# Patient Record
Sex: Female | Born: 1995 | Race: Black or African American | Hispanic: No | Marital: Single | Smoking: Never smoker
Health system: Southern US, Community
[De-identification: ages and names within clinical notes are randomized; demographics above are authoritative.]

## PROBLEM LIST (undated history)

## (undated) DIAGNOSIS — D509 Iron deficiency anemia, unspecified: Secondary | ICD-10-CM

---

## 2014-03-14 ENCOUNTER — Emergency Department (HOSPITAL_COMMUNITY)
Admission: EM | Admit: 2014-03-14 | Discharge: 2014-03-14 | Disposition: A | Discharge status: Home / Self Care | Payer: Managed Care, Other (non HMO) | Attending: Emergency Medicine | Admitting: Emergency Medicine

## 2014-03-14 ENCOUNTER — Encounter (HOSPITAL_COMMUNITY): Payer: Self-pay | Admitting: Emergency Medicine

## 2014-03-14 DIAGNOSIS — Z8709 Personal history of other diseases of the respiratory system: Secondary | ICD-10-CM | POA: Diagnosis not present

## 2014-03-14 DIAGNOSIS — R404 Transient alteration of awareness: Secondary | ICD-10-CM | POA: Insufficient documentation

## 2014-03-14 DIAGNOSIS — R55 Syncope and collapse: Secondary | ICD-10-CM | POA: Diagnosis not present

## 2014-03-14 DIAGNOSIS — Z3202 Encounter for pregnancy test, result negative: Secondary | ICD-10-CM | POA: Insufficient documentation

## 2014-03-14 HISTORY — DX: Iron deficiency anemia, unspecified: D50.9

## 2014-03-14 LAB — CBC WITH DIFFERENTIAL/PLATELET
Basophils Absolute: 0 10*3/uL (ref 0.0–0.1)
Basophils Relative: 0 % (ref 0–1)
EOS PCT: 2 % (ref 0–5)
Eosinophils Absolute: 0.1 10*3/uL (ref 0.0–0.7)
HCT: 33.3 % — ABNORMAL LOW (ref 36.0–46.0)
HEMOGLOBIN: 11.1 g/dL — AB (ref 12.0–15.0)
LYMPHS ABS: 2 10*3/uL (ref 0.7–4.0)
Lymphocytes Relative: 29 % (ref 12–46)
MCH: 29.4 pg (ref 26.0–34.0)
MCHC: 33.3 g/dL (ref 30.0–36.0)
MCV: 88.1 fL (ref 78.0–100.0)
MONOS PCT: 6 % (ref 3–12)
Monocytes Absolute: 0.4 10*3/uL (ref 0.1–1.0)
NEUTROS PCT: 63 % (ref 43–77)
Neutro Abs: 4.4 10*3/uL (ref 1.7–7.7)
Platelets: 257 10*3/uL (ref 150–400)
RBC: 3.78 MIL/uL — AB (ref 3.87–5.11)
RDW: 13.3 % (ref 11.5–15.5)
WBC: 6.8 10*3/uL (ref 4.0–10.5)

## 2014-03-14 LAB — BASIC METABOLIC PANEL
Anion gap: 11 (ref 5–15)
BUN: 12 mg/dL (ref 6–23)
CO2: 24 meq/L (ref 19–32)
Calcium: 9.4 mg/dL (ref 8.4–10.5)
Chloride: 105 mEq/L (ref 96–112)
Creatinine, Ser: 0.53 mg/dL (ref 0.50–1.10)
GFR calc Af Amer: >90 mL/min (ref >90)
GFR calc non Af Amer: >90 mL/min (ref >90)
GLUCOSE: 94 mg/dL (ref 70–99)
POTASSIUM: 4.1 meq/L (ref 3.7–5.3)
SODIUM: 140 meq/L (ref 137–147)

## 2014-03-14 LAB — URINE MICROSCOPIC-ADD ON

## 2014-03-14 LAB — URINALYSIS, ROUTINE W REFLEX MICROSCOPIC
BILIRUBIN URINE: NEGATIVE
GLUCOSE, UA: NEGATIVE mg/dL
KETONES UR: 15 mg/dL — AB
Leukocytes, UA: NEGATIVE
Nitrite: NEGATIVE
PH: 5 (ref 5.0–8.0)
Protein, ur: NEGATIVE mg/dL
Specific Gravity, Urine: 1.021 (ref 1.005–1.030)
Urobilinogen, UA: 0.2 mg/dL (ref 0.0–1.0)

## 2014-03-14 LAB — PREGNANCY, URINE: Preg Test, Ur: NEGATIVE

## 2014-03-14 MED ORDER — SODIUM CHLORIDE 0.9 % IV BOLUS (SEPSIS)
1000.0000 mL | Freq: Once | INTRAVENOUS | Status: AC
  Administered 2014-03-14: 1000 mL via INTRAVENOUS

## 2014-03-14 NOTE — ED Provider Notes (Signed)
CSN: [id number]     Arrival date & time 03/14/14  9562 History   First MD Initiated Contact with Patient 03/14/14 [phone]     No chief complaint on file.    (Consider location/radiation/quality/duration/timing/severity/associated sxs/prior Treatment) HPI Comments: The patient is a 18 year old female with past medical history of anemia presents emergency room chief complaint of syncope just prior to arrival. The patient reports feeling lightheaded, clammy prior to syncopal event. This event was witnessed by bystanders.  The patient reports similar symptoms in the past without a formal evaluation, last episode one to 2 years ago. The patient denies symptoms in emergency apartment and has returned to baseline. She denies recent nausea, vomiting, diarrhea. She reports recent history of bronchitis, no medication treatment. Patient's last menstrual period was 03/10/2014. Denies history of menorrhagia or transfusion. Denies history of cardiac arrhythmia, cardiomyopathy. No recent travel, family history or personal history of DVT/PE, lower extremity swelling, smoking, cancer, or exogenous estrogen.  PCP: [place], [place] Dr. Elease Etienne  The history is provided by the patient. No language interpreter was used.    No past medical history on file. No past surgical history on file. No family history on file. History  Substance Use Topics  . Smoking status: Not on file  . Smokeless tobacco: Not on file  . Alcohol Use: Not on file   OB History   No data available     Review of Systems  Constitutional: Negative for fever and chills.  Eyes: Negative for visual disturbance.  Respiratory: Negative for chest tightness and shortness of breath.   Cardiovascular: Negative for leg swelling.  Gastrointestinal: Negative for nausea, vomiting, abdominal pain and diarrhea.  Musculoskeletal: Negative for back pain and neck pain.  Neurological: Positive for syncope and light-headedness. Negative for weakness and  headaches.      Allergies  Review of patient's allergies indicates not on file.  Home Medications   Prior to Admission medications   Not on File   There were no vitals taken for this visit. Physical Exam  Nursing note and vitals reviewed. Constitutional: She is oriented to person, place, and time. She appears well-developed and well-nourished. No distress.  HENT:  Head: Normocephalic and atraumatic.  Eyes: EOM are normal. Pupils are equal, round, and reactive to light.  Neck: Neck supple.  Cardiovascular: Normal rate and regular rhythm.   No murmur heard. Pulmonary/Chest: Effort normal and breath sounds normal. She has no wheezes. She has no rales.  Abdominal: Soft. Bowel sounds are normal. There is no tenderness. There is no rebound.  Musculoskeletal: Normal range of motion.  Neurological: She is alert and oriented to person, place, and time. No cranial nerve deficit. She exhibits normal muscle tone. GCS eye subscore is 4. GCS verbal subscore is 5. GCS motor subscore is 6.  Speech is clear and goal oriented, follows commands Cranial nerves III - XII grossly intact, no facial droop Normal strength in upper and lower extremities bilaterally, strong and equal grip strength Sensation normal to light touch Moves all 4 extremities without ataxia, coordination intact Normal finger to nose and rapid alternating movements No pronator drift  Skin: Skin is warm and dry. She is not diaphoretic.  Psychiatric: She has a normal mood and affect. Her behavior is normal.    ED Course  Procedures (including critical care time) Labs Review Results for orders placed during the hospital encounter of 03/14/14  URINALYSIS, ROUTINE W REFLEX MICROSCOPIC      Result Value Ref Range   Color,  Urine YELLOW  YELLOW   APPearance CLEAR  CLEAR   Specific Gravity, Urine 1.021  1.005 - 1.030   pH 5.0  5.0 - 8.0   Glucose, UA NEGATIVE  NEGATIVE mg/dL   Hgb urine dipstick MODERATE (*) NEGATIVE    Bilirubin Urine NEGATIVE  NEGATIVE   Ketones, ur 15 (*) NEGATIVE mg/dL   Protein, ur NEGATIVE  NEGATIVE mg/dL   Urobilinogen, UA 0.2  0.0 - 1.0 mg/dL   Nitrite NEGATIVE  NEGATIVE   Leukocytes, UA NEGATIVE  NEGATIVE  PREGNANCY, URINE      Result Value Ref Range   Preg Test, Ur NEGATIVE  NEGATIVE  CBC WITH DIFFERENTIAL      Result Value Ref Range   WBC 6.8  4.0 - 10.5 K/uL   RBC 3.78 (*) 3.87 - 5.11 MIL/uL   Hemoglobin 11.1 (*) 12.0 - 15.0 g/dL   HCT 04.5 (*) 40.9 - 81.1 %   MCV 88.1  78.0 - 100.0 fL   MCH 29.4  26.0 - 34.0 pg   MCHC 33.3  30.0 - 36.0 g/dL   RDW 91.4  78.2 - 95.6 %   Platelets 257  150 - 400 K/uL   Neutrophils Relative % 63  43 - 77 %   Neutro Abs 4.4  1.7 - 7.7 K/uL   Lymphocytes Relative 29  12 - 46 %   Lymphs Abs 2.0  0.7 - 4.0 K/uL   Monocytes Relative 6  3 - 12 %   Monocytes Absolute 0.4  0.1 - 1.0 K/uL   Eosinophils Relative 2  0 - 5 %   Eosinophils Absolute 0.1  0.0 - 0.7 K/uL   Basophils Relative 0  0 - 1 %   Basophils Absolute 0.0  0.0 - 0.1 K/uL  BASIC METABOLIC PANEL      Result Value Ref Range   Sodium 140  137 - 147 mEq/L   Potassium 4.1  3.7 - 5.3 mEq/L   Chloride 105  96 - 112 mEq/L   CO2 24  19 - 32 mEq/L   Glucose, Bld 94  70 - 99 mg/dL   BUN 12  6 - 23 mg/dL   Creatinine, Ser 2.13  0.50 - 1.10 mg/dL   Calcium 9.4  8.4 - 08.6 mg/dL   GFR calc non Af Amer >90  >90 mL/min   GFR calc Af Amer >90  >90 mL/min   Anion gap 11  5 - 15  URINE MICROSCOPIC-ADD ON      Result Value Ref Range   Squamous Epithelial / LPF FEW (*) RARE   RBC / HPF 3-6  <3 RBC/hpf   Urine-Other MUCOUS PRESENT     No results found.   Imaging Review No results found.   EKG Interpretation   Date/Time:  Thursday March 14 2014 09:35:09 EDT Ventricular Rate:  65 PR Interval:  133 QRS Duration: 89 QT Interval:  407 QTC Calculation: 423 R Axis:   37 Text Interpretation:  Sinus rhythm LVH by voltage Otherwise within normal  limits Confirmed by POLLINA  MD,  CHRISTOPHER ([id number]) on 03/14/2014 9:41:58  AM      MDM   Final diagnoses:  Syncope, unspecified syncope type   Patient presents after a witnessed syncopal event denies current symptoms. History of anemia comment no other past no history. Pt is PERC negative. No neurologic deficits on exam EKG shows sinus rhythm and LVH.  Re-eval awaiting lab draw. BP 95/62 fluid bolus ordered. Orthostatic vital signs  negative. CBC hemoglobin 11.1. BMP unremarkable. Urine pregnancy negative, UA without infection. 1225 reevaluation patient resting comfortably in room denies headache, nausea, vomiting, chest pain or shortness breath, lightheadedness or dizziness. Discussed lab results with the patient and followup with PCP. Discussed eating a well-balanced diet and plenty of fluids. Return precautions given. Heart rate 74, blood pressure 104/60's.  Mellody Drown, PA-C 03/14/14 1233

## 2014-03-14 NOTE — ED Notes (Signed)
EMS- pt is student at [employer] was in line for breakfast and begin feeling weak and dizzy pt then had syncopal episode. Positive for LOC. Vital signs stable with EMS BP: 118/70, pulse 78, CBG: 93. Pt alert and oriented on arrival. Pt has history of anemia.

## 2014-03-14 NOTE — Discharge Instructions (Signed)
Call for a follow up appointment with a Family or Primary Care Provider.  Return if Symptoms worsen.   Take medication as prescribed.  Drink plenty of fluids, eat a well balance diet.   Emergency Department Resource Guide 1) Find a Doctor and Pay Out of Pocket Although you won't have to find out who is covered by your insurance plan, it is a good idea to ask around and get recommendations. You will then need to call the office and see if the doctor you have chosen will accept you as a new patient and what types of options they offer for patients who are self-pay. Some doctors offer discounts or will set up payment plans for their patients who do not have insurance, but you will need to ask so you aren't surprised when you get to your appointment.  2) Contact Your Local Health Department Not all health departments have doctors that can see patients for sick visits, but many do, so it is worth a call to see if yours does. If you don't know where your local health department is, you can check in your phone book. The CDC also has a tool to help you locate your state's health department, and many state websites also have listings of all of their local health departments.  3) Find a Walk-in Clinic If your illness is not likely to be very severe or complicated, you may want to try a walk in clinic. These are popping up all over the country in pharmacies, drugstores, and shopping centers. They're usually staffed by nurse practitioners or physician assistants that have been trained to treat common illnesses and complaints. They're usually fairly quick and inexpensive. However, if you have serious medical issues or chronic medical problems, these are probably not your best option.  No Primary Care Doctor: - Call Health Connect at  [phone] - they can help you locate a primary care doctor that  accepts your insurance, provides certain services, etc. - Physician Referral Service- [phone]  Chronic Pain  Problems: Organization         Address  Phone   Notes  [facility]  [phone] Patients need to be referred by their primary care doctor.   Medication Assistance: Organization         Address  Phone   Notes  [facility] Medication [facility] [address] [place] [place], [place] [address] [phone] --Must be a resident of [facility] -- Must have NO insurance coverage whatsoever (no Medicaid/ Medicare, etc.) -- The pt. MUST have a primary care doctor that directs their care regularly and follows them in the community   [facility]  [phone]   [employer]  [phone]    Agencies that provide inexpensive medical care: Organization         Address  Phone   Notes  [facility]  [phone]   [facility]    [phone]   [facility] [address] [place], [place] [address] [phone]   [facility] [address] [place] [place] [phone]   Planned Parenthood    [phone]   [facility]    [phone]   [facility]  [address], [place] Phone:  [phone], Fax:  [phone] Hours of Operation:  9 am - 6 pm, M-F.  Also accepts Medicaid/Medicare and self-pay.  [facility]  [address], [place] Phone: [phone], Fax: [phone]. Hours of Operation:  8:30 am - 5:30 pm, M-F.  Also accepts Medicaid and self-pay.  [facility] [address], [place] Phone: [phone]   [place], [place], [place] [phone], Ext. [phone] Mondays & Thursdays: 7-9 AM.  First 15 patients are seen on a first come, first serve basis.    [place] Providers:  Organization         Address  Phone   Notes  [facility] [address], [place] [phone] Also  accepts self-pay patients.  [facility] [address], [place]  [phone]   [place], [address], [place] [phone]   [facility] [address], [place] [phone]   Lucianne Lei [address], [place]   [phone] Only accepts [place] Access [place] patients after they have their name applied to their card.   Self-Pay (no insurance) in [facility]:  Organization         Address  Phone   Notes  Sickle Cell Patients, [facility] [place] [phone]   [facility] [place] [phone]   [facility] [place]  [place], [place], [place] [phone]   [facility]/Dr. Osei-Bonsu  [address] [place] or [address], [place] [phone] Phone number for both [place] and [place] locations is the same.  [facility] [address], [place] [phone]   [facility] [address], [place] or [address] [phone]   [facility] [address] [place] [phone], phone; [phone], fax Sees patients 1st and 3rd Saturday of every month.  Must not qualify for public or private insurance (i.e. Medicaid, Medicare, [place] Health Choice, Veterans' Benefits)  Household income should be no more than 200% of the poverty level The clinic cannot treat you if you are pregnant or think you are pregnant  Sexually transmitted diseases are not treated at the clinic.    Dental Care: Organization         Address  Phone  Notes  [facility] Department of [facility] [phone] Accepts children up to age 78 who are enrolled in [place] or [place]; pregnant  women with a Medicaid card; and children who have applied for Medicaid or [place] Health Choice, but were declined, whose parents can pay a reduced fee at time of service.  [facility] Department of [facility]  [address], [place] [phone] Accepts children up to age 58 who are enrolled in [place] or [place]; pregnant women with a Medicaid card; and children who have applied for Medicaid or [place] Health Choice, but were declined, whose parents can pay a reduced fee at time of service.  [facility]  [place] [phone] Patients are seen by appointment only. Walk-ins are not accepted. [place] will see patients 67 years of age and older. Monday - Tuesday (8am-5pm) Most Wednesdays (8:30-5pm) $30 per visit, cash only  [facility]  [address], [facility] [phone] Patients are seen by appointment only. Walk-ins are not accepted. [place] will see patients 29 years of age and older. One Wednesday Evening (Monthly: Volunteer Based).  $30 per visit, cash only  [place]  [phone] for adults; Children under age 108, call Graduate Pediatric Dentistry at [phone]. Children aged 52-14, please call [phone] to request a pediatric application.  Dental services are provided in all areas of dental care including fillings, crowns and bridges, complete and partial dentures, implants, gum treatment, root canals, and extractions. Preventive care is also provided. Treatment is provided to both adults and children. Patients are selected via a lottery and there is often a waiting list.   [facility] [address] [place]  [phone] [email or web address]   [facility] [address] [place], [place] [phone], Ext. [phone] Second and Fourth Thursday of each month, opens at 6:30 AM; Clinic ends at 9 AM.  Patients are  seen on a first-come first-served basis, and a limited number are seen during each clinic.   [facility]  [address] Hillard Danker [place], [place] [phone]   Eligibility Requirements You must have lived in [place], [place], or [place] counties for at least the last three months.   You cannot be eligible for state or federal sponsored [employer], including [employer], [place], or [employer].   You generally cannot be eligible for healthcare insurance through your employer.    How to apply: Eligibility screenings are held every Tuesday and Wednesday afternoon from 1:00 pm until 4:00 pm. You do not need an appointment for the interview!  [facility] [address], [place], [place]   [place] Department  [place]  [phone]    Behavioral Health Resources in the Community: Intensive Outpatient Programs Organization         Address  Phone  Notes  [place]. [address], [place], [place] [phone]   [facility] Outpatient [address], [place], [place]   ADS: [facility] [address] [place], [place]   [place] [address]  [place], [place] or [phone]   Substance Abuse Resources Organization         Address  Phone  Notes  Alcohol and Drug Services  [phone]   [place]  [phone]   [facility] [place]   Chinita Pester  [phone]   Residential & Outpatient Substance Abuse Program  [phone]   Psychological Services Organization         Address  Phone  Notes  [facility] [place]  [place]  [phone]   [place] [address], [place] [phone] or [phone]    Mobile Crisis  Teams Organization         Address  Phone  Notes  [facility], Mobile Crisis Care Unit  [phone]   [facility]  [address] [place], [place]   [place] [address], [place] [phone]    Self-Help/Support Groups Organization         Address  Phone             Notes  [facility] [place] - variety of support groups  [phone]- [phone] Call for more information  Narcotics Anonymous (NA), [facility] [address], [employer] [place]  2 meetings at this location   Statistician         Address  Phone  Notes  [facility] [address],    [place]  [phone]   [facility]  [address] [place] [address], [place], [place] [phone]   [facility] Treatment Facility [address] [place], [place] [phone] Admissions: 8am-3pm M-F  [facility] [address]    [place], [place] [phone]   [facility] [address] [place], [place], [place] [phone]   [facility] [address]  [place], [place] [phone]   [facility] - Intensive Outpatient [address], [place], [place] [phone]   [facility] (Addiction Recovery [facility] [address] [place]  [place], [place] [phone] or [phone]   [facility] ([facility]) [address] [place], [place] [phone] Accepts Medicaid  [facility] [address],  [place] [phone] Substance Abuse/Addiction Treatment   [facility] Organization         Address  Phone  Notes  [facility]  [phone]   Angie Fava, PhD [address] Ervin Knack [place], [place]   [phone] or [phone]   [facility]al   [address] [place], [place] [phone]   [facility] [address] [place], [place] [phone]  Insurance/Medicaid/sponsorship through [facility] and Families [address]                                    [place], [place] [phone] Therapy/tele-psych/case  [facility] [address], [place] [phone]    Dr. Lolly Mustache  [phone]   [facility] [place] [facility]  [employer] [facility] Dept. 1) [address], [place] 2) [address] [place] 3)  [address], [place] [phone]  [phone]   [facility] Child Abuse Hotline (567)013-7258 or [phone] (After Hours)

## 2014-03-14 NOTE — ED Provider Notes (Signed)
Medical screening examination/treatment/procedure(s) were performed by non-physician practitioner and as supervising physician I was immediately available for consultation/collaboration.   EKG Interpretation   Date/Time:  Thursday March 14 2014 09:35:09 EDT Ventricular Rate:  65 PR Interval:  133 QRS Duration: 89 QT Interval:  407 QTC Calculation: 423 R Axis:   37 Text Interpretation:  Sinus rhythm LVH by voltage Otherwise within normal  limits Confirmed by Blinda Leatherwood  MD, Lula Kolton ([id number]) on 03/14/2014 9:41:58  AM        Gilda Crease, MD 03/14/14 1241

## 2016-10-10 ENCOUNTER — Ambulatory Visit (HOSPITAL_COMMUNITY)
Admission: EM | Admit: 2016-10-10 | Discharge: 2016-10-10 | Disposition: A | Discharge status: Home / Self Care | Payer: Managed Care, Other (non HMO) | Attending: Family Medicine | Admitting: Family Medicine

## 2016-10-10 ENCOUNTER — Encounter (HOSPITAL_COMMUNITY): Payer: Self-pay | Admitting: Emergency Medicine

## 2016-10-10 ENCOUNTER — Ambulatory Visit (INDEPENDENT_AMBULATORY_CARE_PROVIDER_SITE_OTHER): Payer: Managed Care, Other (non HMO)

## 2016-10-10 DIAGNOSIS — S62635A Displaced fracture of distal phalanx of left ring finger, initial encounter for closed fracture: Secondary | ICD-10-CM | POA: Diagnosis not present

## 2016-10-10 DIAGNOSIS — S62629A Displaced fracture of medial phalanx of unspecified finger, initial encounter for closed fracture: Secondary | ICD-10-CM

## 2016-10-10 MED ORDER — NAPROXEN 250 MG PO TABS: 250.0000 mg | ORAL_TABLET | Freq: Two times a day (BID) | ORAL | 0 refills | Status: AC

## 2016-10-10 NOTE — ED Provider Notes (Signed)
CSN: [id number]     Arrival date & time 10/10/16  1212 History   None    Chief Complaint  Patient presents with  . Finger Injury   (Consider location/radiation/quality/duration/timing/severity/associated sxs/prior Treatment) Patient c/o left ring finger swelling and discomfort due to having it slammed in a drawer yesterday.   The history is provided by the patient.  Hand Pain  This is a new problem. The problem occurs constantly. The problem has not changed since onset.Nothing aggravates the symptoms. Nothing relieves the symptoms. She has tried nothing for the symptoms.    Past Medical History:  Diagnosis Date  . Anemia, iron deficiency    History reviewed. No pertinent surgical history. History reviewed. No pertinent family history. Social History  Substance Use Topics  . Smoking status: Never Smoker  . Smokeless tobacco: Current User  . Alcohol use No   OB History    No data available     Review of Systems  Constitutional: Negative.   HENT: Negative.   Eyes: Negative.   Respiratory: Negative.   Cardiovascular: Negative.   Gastrointestinal: Negative.   Endocrine: Negative.   Genitourinary: Negative.   Musculoskeletal: Positive for arthralgias.  Allergic/Immunologic: Negative.   Neurological: Negative.   Hematological: Negative.   Psychiatric/Behavioral: Negative.     Allergies  Patient has no known allergies.  Home Medications   Prior to Admission medications   Medication Sig Start Date End Date Taking? Authorizing Provider  naproxen (NAPROSYN) 250 MG tablet Take 1 tablet (250 mg total) by mouth 2 (two) times daily with a meal. 10/10/16   Deatra Canter, FNP   Meds Ordered and Administered this Visit  Medications - No data to display  BP 123/64 (BP Location: Right Arm)   Pulse 88   Temp 98.5 F (36.9 C) (Oral)   Resp 18   SpO2 100%  No data found.   Physical Exam  Constitutional: She appears well-developed and well-nourished.  HENT:  Head:  Normocephalic and atraumatic.  Eyes: Conjunctivae and EOM are normal. Pupils are equal, round, and reactive to light.  Neck: Normal range of motion. Neck supple.  Cardiovascular: Normal rate, regular rhythm and normal heart sounds.   Pulmonary/Chest: Effort normal and breath sounds normal.  Musculoskeletal: She exhibits tenderness.  Left 4th ring finger with swelling at MIP joint  Nursing note and vitals reviewed.   Urgent Care Course     Procedures (including critical care time)  Labs Review Labs Reviewed - No data to display  Imaging Review Dg Finger Ring Left  Result Date: 10/10/2016 CLINICAL DATA:  Left ring finger pain and swelling following a crush injury in a drawer yesterday. EXAM: LEFT RING FINGER 2+V COMPARISON:  None. FINDINGS: Mild diffuse soft tissue swelling in the proximal left ring finger. Small volar plate avulsion fracture of the base of the fourth middle phalanx. No other fractures or radiopaque foreign bodies. IMPRESSION: Small volar plate fracture of the base of the fourth middle phalanx. Electronically Signed   By: Beckie Salts M.D.   On: 10/10/2016 13:45     Visual Acuity Review  Right Eye Distance:   Left Eye Distance:   Bilateral Distance:    Right Eye Near:   Left Eye Near:    Bilateral Near:         MDM   1. Closed avulsion fracture of middle phalanx of finger, initial encounter    Buddy tape left ring finger Naprosyn  one po bid x 10 days #20  Deatra Canter, FNP 10/10/16 1401

## 2016-10-10 NOTE — ED Triage Notes (Signed)
The patient presented to the [facility] with a complaint of pain and swelling to the ring finger on her left hand secondary to it being slammed in a drawer yesterday.

## 2016-10-15 ENCOUNTER — Ambulatory Visit: Payer: Managed Care, Other (non HMO) | Attending: Internal Medicine | Admitting: Physician Assistant

## 2016-10-15 VITALS — BP 101/67 | HR 79 | Temp 98.3°F | Resp 16 | Wt 128.0 lb

## 2016-10-15 DIAGNOSIS — W230XXA Caught, crushed, jammed, or pinched between moving objects, initial encounter: Secondary | ICD-10-CM | POA: Insufficient documentation

## 2016-10-15 DIAGNOSIS — S62655A Nondisplaced fracture of medial phalanx of left ring finger, initial encounter for closed fracture: Secondary | ICD-10-CM | POA: Insufficient documentation

## 2016-10-15 NOTE — Progress Notes (Signed)
Carrie Schneider, is a 21 y.o. female  ZOX:[id number]  [id number]:[id number]  DOB - [birth year]  Subjective:  Chief Complaint and HPI: Carrie Schneider is a 21 y.o. female here today to establish care and for a follow up visit after being seen in the ED 10/10/2016 for a L ring finger fracture sustained on 10/09/2016.   She slammed her finger in a drawer accidentally. Her fingers were buddy taped together in the hospital, but she got herself a fold over splint that she has been wearing.  Minimal discomfort and minimal swelling.  She is R hand dominant.  She denies any health problems.  She single, not in a relationship and has never been sexually active.  ED/Hospital notes reviewed.  Xray:  Small volar plate fracture of the base of the fourth middle phalanx.  ROS:   Constitutional:  No f/c, No night sweats, No unexplained weight loss. EENT:  No vision changes, No blurry vision, No hearing changes. No mouth, throat, or ear problems.  Respiratory: No cough, No SOB Cardiac: No CP, no palpitations GI:  No abd pain, No N/V/D. GU: No Urinary s/sx Musculoskeletal: No joint pain Neuro: No headache, no dizziness, no motor weakness.  Skin: No rash Endocrine:  No polydipsia. No polyuria.  Psych: Denies SI/HI  No problems updated.  ALLERGIES: No Known Allergies  PAST MEDICAL HISTORY: Past Medical History:  Diagnosis Date  . Anemia, iron deficiency     MEDICATIONS AT HOME: Prior to Admission medications   Medication Sig Start Date End Date Taking? Authorizing Provider  naproxen (NAPROSYN) 250 MG tablet Take 1 tablet (250 mg total) by mouth 2 (two) times daily with a meal. Patient not taking: Reported on 10/15/2016 10/10/16   Deatra Canter, FNP     Objective:  EXAM:   Vitals:   10/15/16 1024  BP: 101/67  Pulse: 79  Resp: 16  Temp: 98.3 F (36.8 C)  TempSrc: Oral  SpO2: 95%  Weight: 128 lb (58.1 kg)    General appearance : A&OX3. NAD. Non-toxic-appearing HEENT: Atraumatic and  Normocephalic.  PERRLA. EOM intact.  Neck: supple, no JVD. No cervical lymphadenopathy. No thyromegaly Chest/Lungs:  Breathing-non-labored, Good air entry bilaterally, breath sounds normal without rales, rhonchi, or wheezing  CVS: S1 S2 regular, no murmurs, gallops, rubs  Extremities: L hand/4th digit with minimal swelling at the middle phalanx and only minimally TTP.  N-V intact and no ecchymoses. Bilateral Lower Ext shows no edema, both legs are warm to touch with = pulse throughout Neurology:  CN II-XII grossly intact, Non focal.   Psych:  TP linear. J/I WNL. Normal speech. Appropriate eye contact and affect.  Skin:  No Rash  Data Review No results found for: HGBA1C   Assessment & Plan   1. Nondisplaced fracture of medial phalanx of left ring finger, initial encounter for closed fracture - DG Finger Ring Left; Future Continue wearing splint until after repeat xray is done. Naproxen or ibuprofen prn     Patient have been counseled extensively about nutrition and exercise  Return in about 6 weeks (around 11/26/2016) for assign PCP; CPE and fasting blood work.  The patient was given clear instructions to go to ER or return to medical center if symptoms don't improve, worsen or new problems develop. The patient verbalized understanding. The patient was told to call to get lab results if they haven't heard anything in the next week.     Georgian Co, PA-C [facility] and [facility] [place], [place]  [phone]   10/15/2016, 10:47 AMPatient ID: Carrie Schneider, female   DOB: 1996/05/18, 21 y.o.   MRN: [id number]

## 2016-11-09 ENCOUNTER — Ambulatory Visit (HOSPITAL_COMMUNITY)
Admission: RE | Admit: 2016-11-09 | Discharge: 2016-11-09 | Disposition: A | Discharge status: Home / Self Care | Payer: Managed Care, Other (non HMO) | Source: Ambulatory Visit | Attending: Physician Assistant | Admitting: Physician Assistant

## 2016-11-09 DIAGNOSIS — W230XXA Caught, crushed, jammed, or pinched between moving objects, initial encounter: Secondary | ICD-10-CM | POA: Diagnosis not present

## 2016-11-09 DIAGNOSIS — S62655A Nondisplaced fracture of medial phalanx of left ring finger, initial encounter for closed fracture: Secondary | ICD-10-CM | POA: Insufficient documentation

## 2016-11-10 ENCOUNTER — Telehealth: Payer: Self-pay

## 2016-11-10 NOTE — Telephone Encounter (Signed)
Contacted pt to go over xray results pt is aware and doesn't have any questions or concerns  

## 2018-02-25 IMAGING — DX DG FINGER RING 2+V*L*
3 series · 3 of 3 positions shown · non-contrast
Comparison: 10/10/2016.

CLINICAL DATA: 20-year-old female slammed finger in door. Fracture.
Follow-up.

EXAM:
LEFT RING FINGER 2+V

[finger ap]
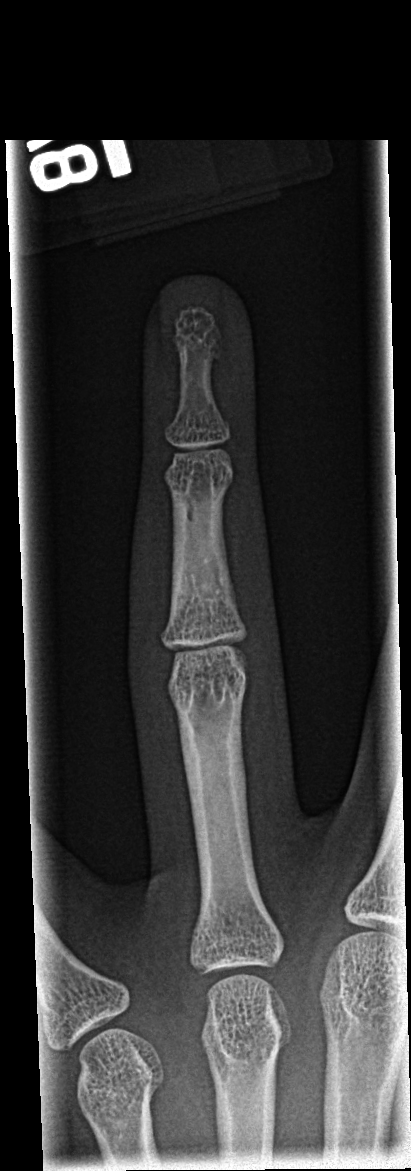

[finger obl]
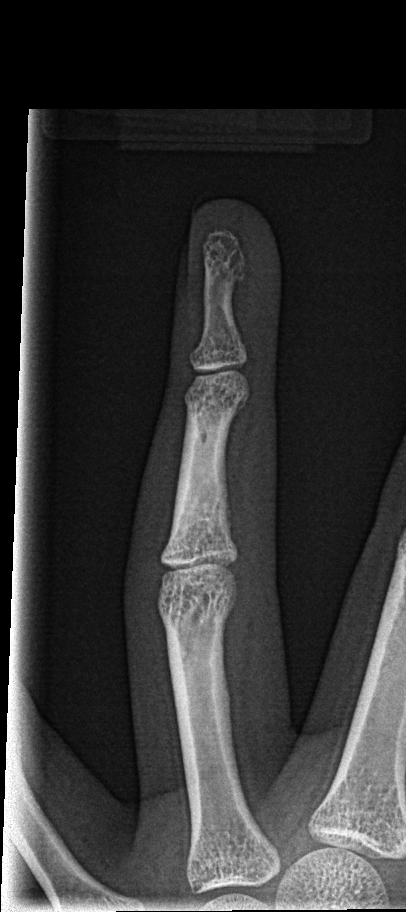

[finger lat]
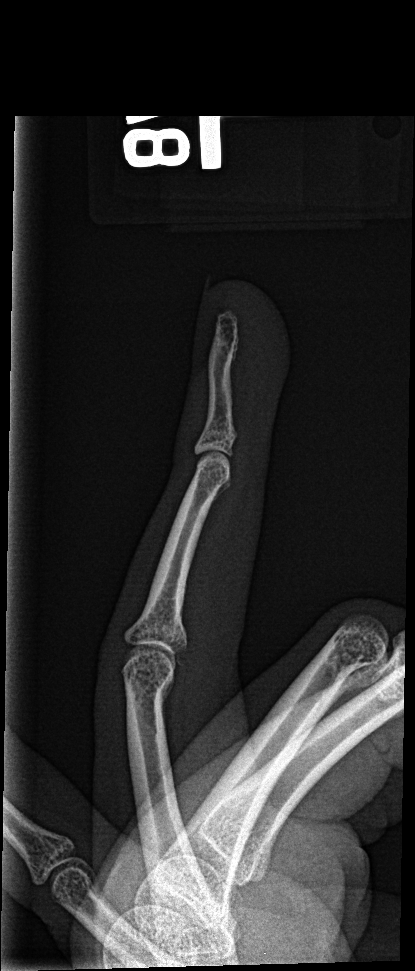

[3 of 3 positions shown; findings below may reference images not displayed]

FINDINGS: Left fourth finger middle phalanx proximal volar plate avulsion
fracture with slight displacement appears similar to prior exam.

Minimal decrease in degree surrounding soft tissue swelling.
IMPRESSION: Left fourth finger middle phalanx proximal volar plate avulsion
fracture with slight displacement appears similar to prior exam.
